# Patient Record
Sex: Female | Born: 1980 | Race: Black or African American | Hispanic: No | Marital: Single | Smoking: Never smoker
Health system: Southern US, Community
[De-identification: ages and names within clinical notes are randomized; demographics above are authoritative.]

## PROBLEM LIST (undated history)

## (undated) DIAGNOSIS — F410 Panic disorder [episodic paroxysmal anxiety] without agoraphobia: Secondary | ICD-10-CM

## (undated) HISTORY — DX: Panic disorder (episodic paroxysmal anxiety): F41.0

---

## 2001-07-12 ENCOUNTER — Emergency Department (HOSPITAL_COMMUNITY): Admission: EM | Admit: 2001-07-12 | Discharge: 2001-07-12 | Payer: Self-pay | Admitting: Emergency Medicine

## 2002-04-03 ENCOUNTER — Emergency Department (HOSPITAL_COMMUNITY): Admission: EM | Admit: 2002-04-03 | Discharge: 2002-04-03 | Payer: Self-pay

## 2002-12-20 ENCOUNTER — Inpatient Hospital Stay (HOSPITAL_COMMUNITY): Admission: AD | Admit: 2002-12-20 | Discharge: 2002-12-22 | Payer: Self-pay | Admitting: *Deleted

## 2010-04-06 ENCOUNTER — Encounter: Admission: RE | Admit: 2010-04-06 | Discharge: 2010-04-06 | Payer: Self-pay | Admitting: Family Medicine

## 2012-10-12 ENCOUNTER — Other Ambulatory Visit: Payer: Self-pay | Admitting: Gastroenterology

## 2012-10-12 DIAGNOSIS — K5900 Constipation, unspecified: Secondary | ICD-10-CM

## 2012-10-23 ENCOUNTER — Other Ambulatory Visit: Payer: Self-pay

## 2012-11-02 ENCOUNTER — Ambulatory Visit
Admission: RE | Admit: 2012-11-02 | Discharge: 2012-11-02 | Disposition: A | Discharge status: Home / Self Care | Payer: BC Managed Care – PPO | Source: Ambulatory Visit | Attending: Gastroenterology | Admitting: Gastroenterology

## 2012-11-02 DIAGNOSIS — K5900 Constipation, unspecified: Secondary | ICD-10-CM

## 2013-12-01 DIAGNOSIS — F419 Anxiety disorder, unspecified: Secondary | ICD-10-CM | POA: Insufficient documentation

## 2013-12-01 DIAGNOSIS — F410 Panic disorder [episodic paroxysmal anxiety] without agoraphobia: Secondary | ICD-10-CM | POA: Insufficient documentation

## 2014-03-15 IMAGING — RF DG BE W/ AIR HIGH DENSITY
20 of 24 series · 20 of 24 positions shown · non-contrast
Comparison: None.

CLINICAL DATA: Constipation.

AIR-CONTRAST BARIUM ENEMA
TECHNIQUE: After obtaining a scout radiograph air-contrast barium
enema was performed under fluoroscopy using high-density barium.
Fluoroscopy Time: 4 minutes 30 seconds.

[Series 1: run · 1 of 1 slices shown (1 of 20)]
[im 1/1]
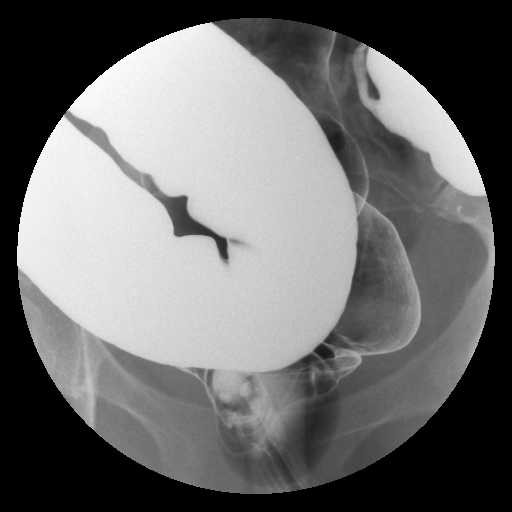

[Series 2: run · 1 of 1 slices shown (2 of 20)]
[im 1/1]
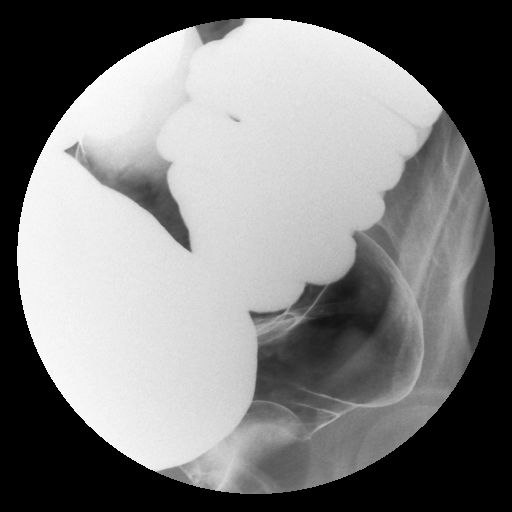

[Series 4: run · 1 of 1 slices shown (3 of 20)]
[im 1/1]
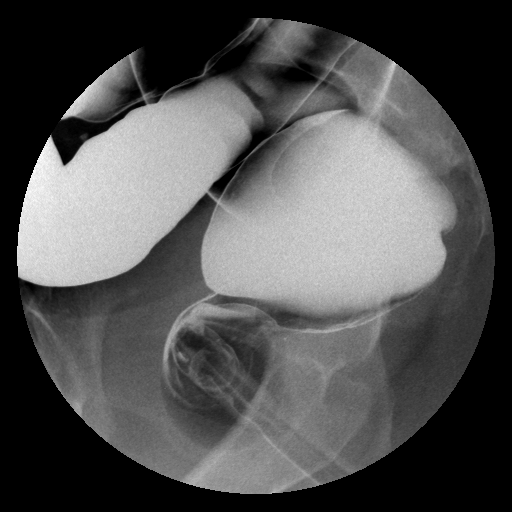

[Series 5: run · 1 of 1 slices shown (4 of 20)]
[im 1/1]
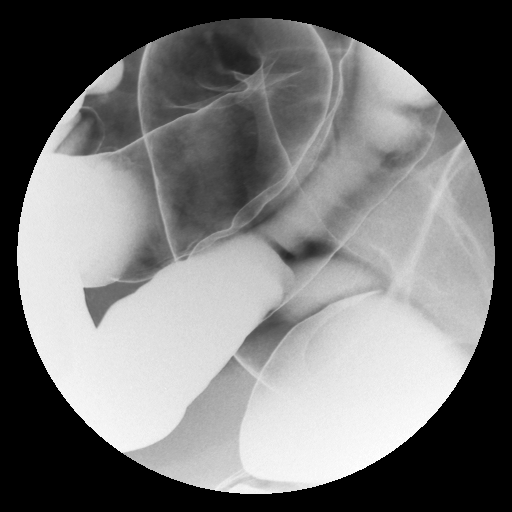

[Series 6: run · 1 of 1 slices shown (5 of 20)]
[im 1/1]
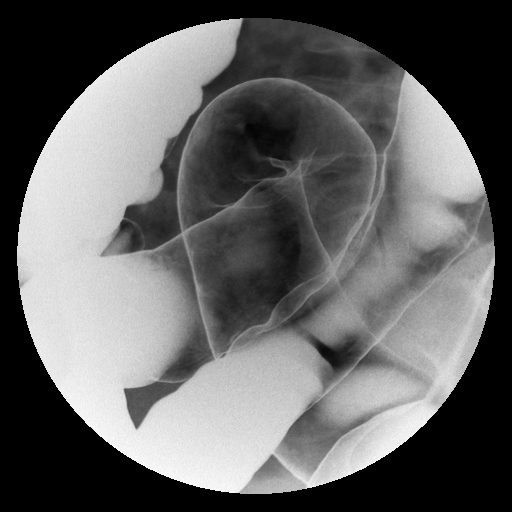

[Series 7: run · 1 of 1 slices shown (6 of 20)]
[im 1/1]
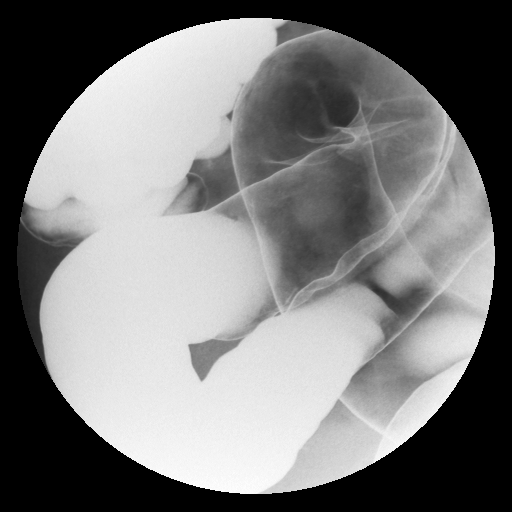

[Series 8: run · 1 of 1 slices shown (7 of 20)]
[im 1/1]
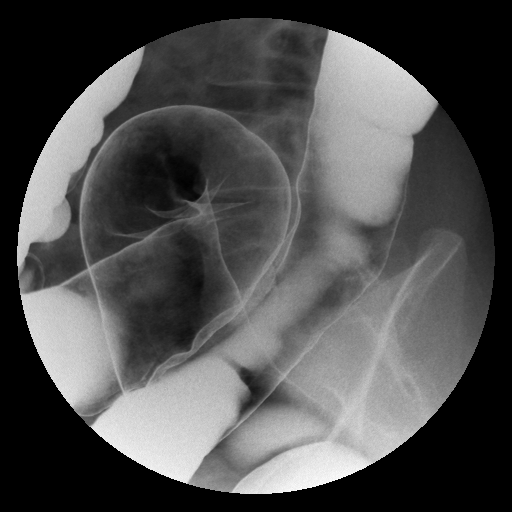

[Series 10: run · 1 of 1 slices shown (8 of 20)]
[im 1/1]
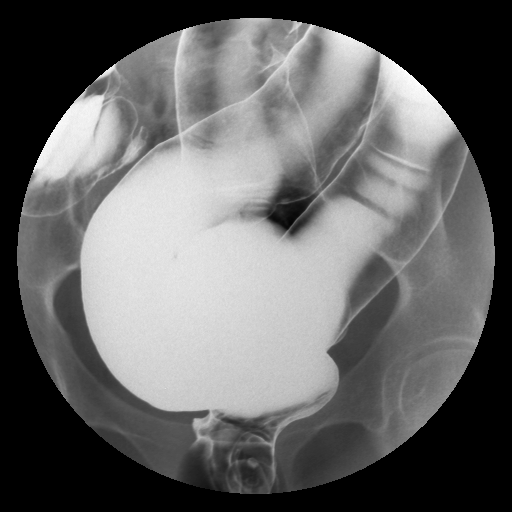

[Series 11: run · 1 of 1 slices shown (9 of 20)]
[im 1/1]
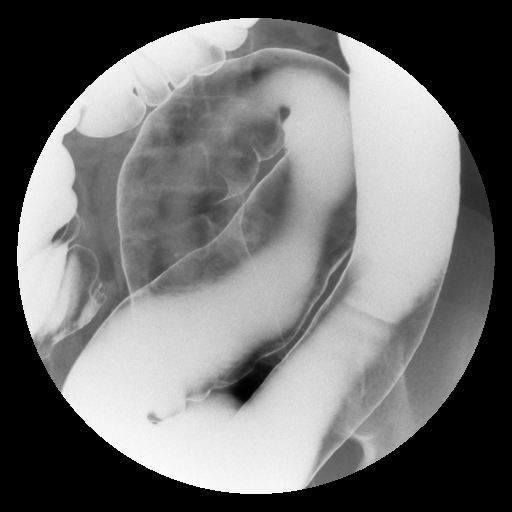

[Series 12: run · 1 of 1 slices shown (10 of 20)]
[im 1/1]
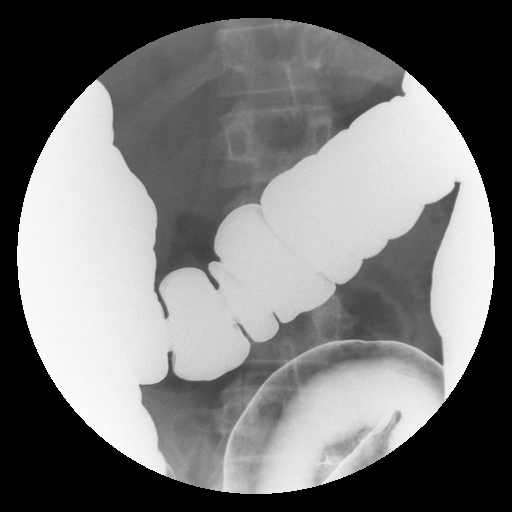

[Series 13: run · 1 of 1 slices shown (11 of 20)]
[im 1/1]
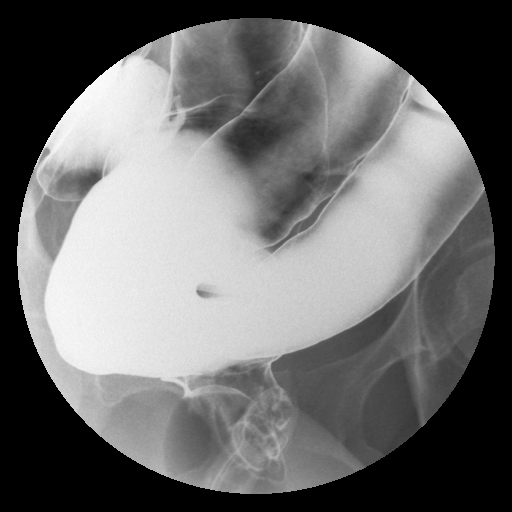

[Series 14: run · 1 of 1 slices shown (12 of 20)]
[im 1/1]
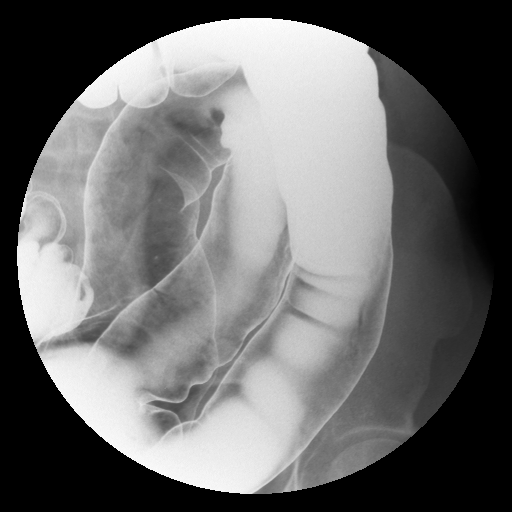

[Series 16: run · 1 of 1 slices shown (13 of 20)]
[im 1/1]
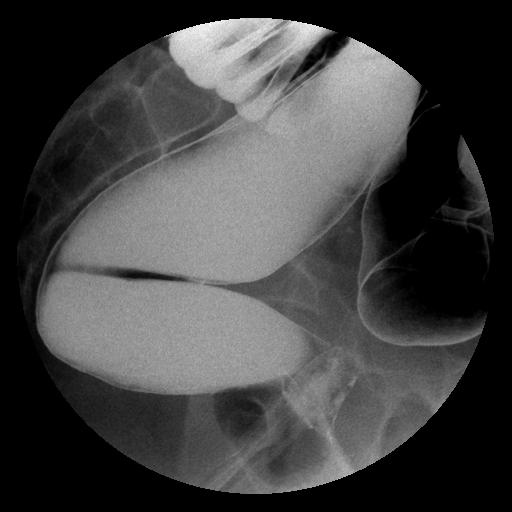

[Series 17: run · 1 of 1 slices shown (14 of 20)]
[im 1/1]
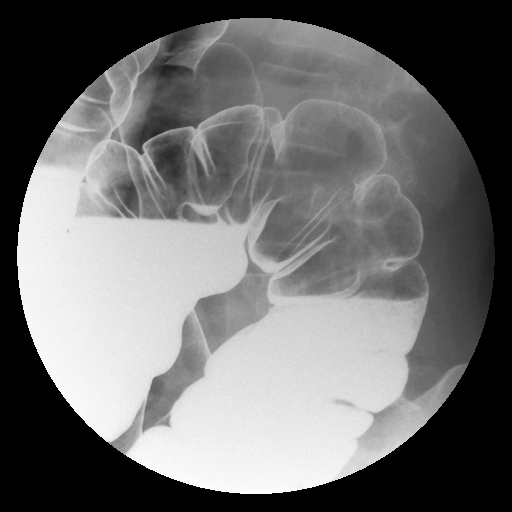

[Series 18: run · 1 of 1 slices shown (15 of 20)]
[im 1/1]
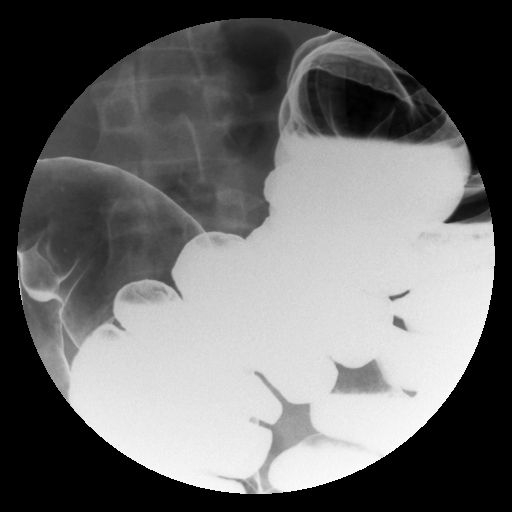

[Series 19: run · 1 of 1 slices shown (16 of 20)]
[im 1/1]
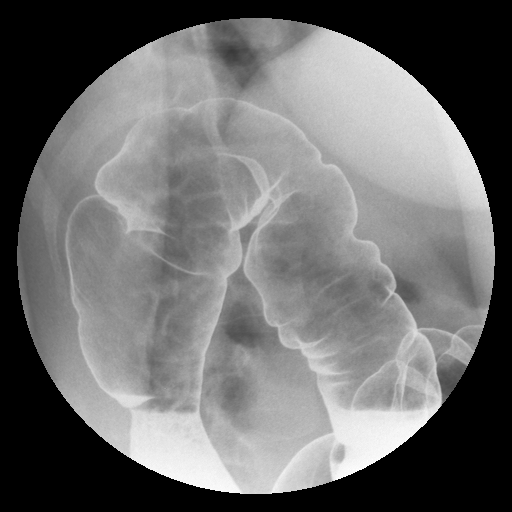

[Series 20: run · 1 of 1 slices shown (17 of 20)]
[im 1/1]
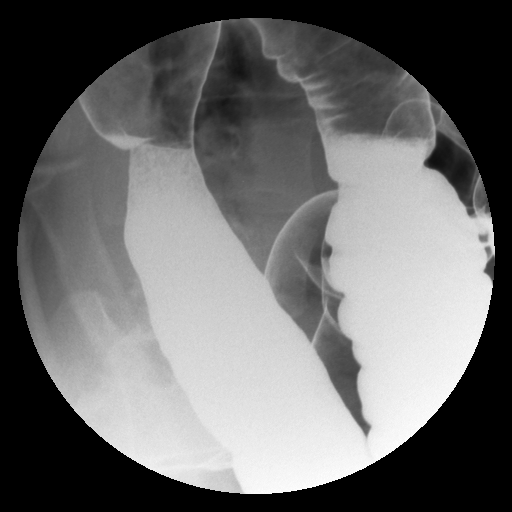

[Series 22: run · 1 of 1 slices shown (18 of 20)]
[im 1/1]
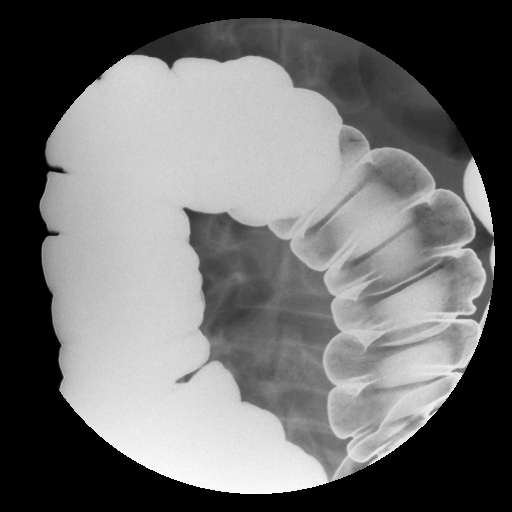

[Series 23: run · 1 of 1 slices shown (19 of 20)]
[im 1/1]
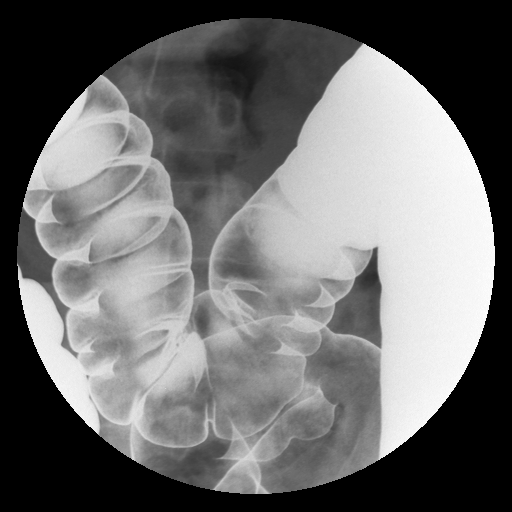

[Series 24: run · 1 of 1 slices shown (20 of 20)]
[im 1/1]
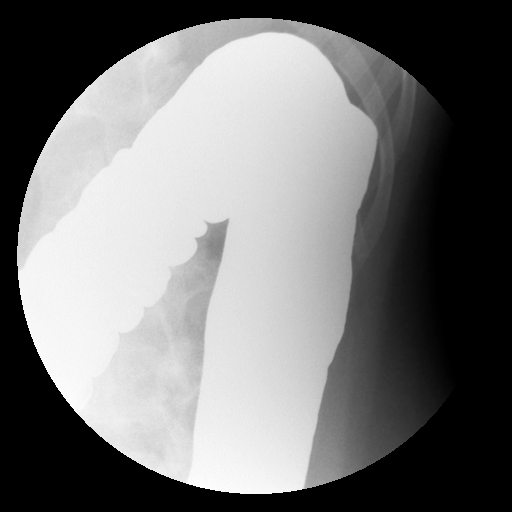

[20 of 24 positions shown; findings below may reference images not displayed]

FINDINGS: Scout view of the abdomen shows a normal bowel gas
pattern.  No unexpected radiopaque calcifications.  Intrauterine
contraceptive device is seen in the anatomic pelvis.

Double contrast examination of the colon shows a tortuous sigmoid.
No polyp, stricture or mass.
IMPRESSION: Normal exam.

## 2019-11-15 ENCOUNTER — Encounter: Payer: Self-pay | Admitting: Emergency Medicine

## 2019-11-15 ENCOUNTER — Other Ambulatory Visit: Payer: Self-pay

## 2019-11-15 ENCOUNTER — Emergency Department
Admission: EM | Admit: 2019-11-15 | Discharge: 2019-11-15 | Disposition: A | Discharge status: Home / Self Care | Payer: BC Managed Care – PPO | Source: Home / Self Care | Attending: Family Medicine | Admitting: Family Medicine

## 2019-11-15 DIAGNOSIS — B029 Zoster without complications: Secondary | ICD-10-CM

## 2019-11-15 MED ORDER — PREDNISONE 20 MG PO TABS: ORAL_TABLET | ORAL | 0 refills | Status: AC

## 2019-11-15 MED ORDER — VALACYCLOVIR HCL 1 G PO TABS: ORAL_TABLET | ORAL | 0 refills | Status: AC

## 2019-11-15 NOTE — ED Triage Notes (Signed)
Rash (small bumps) in front of R ear on Friday night Woke up this am with bumps around mouth & one on the inside Pt c/o of sharp tingling sensation  Took Benadryl last night - min relief Covid Vaccine [employer] April ) Denies any new detergents or hair products

## 2019-11-15 NOTE — ED Provider Notes (Signed)
[facility]    CSN: [id number] Arrival date & time: 11/15/19  1315      History   Chief Complaint Chief Complaint  Patient presents with  . Rash    HPI Meghan Rodriguez is a 39 y.o. female.   Patient reports that she developed a tingling sensation in her right upper lateral lip three days ago followed by some small bumps on her right cheek and in front of her right ear.  She also has some small lesions on her right anterior buccal mucosae.  She has developed vague pain in her right ear canal.  She feels well otherwise.  The history is provided by the patient.  Rash Location:  Face Facial rash location:  R cheek and upper lip Quality: dryness, itchiness, painful and redness   Quality: not blistering, not bruising, not burning, not draining, not peeling, not scaling, not swelling and not weeping   Pain details:    Quality:  Itching and tingling   Severity:  Mild   Onset quality:  Gradual   Duration:  3 days   Timing:  Constant   Progression:  Worsening Severity:  Mild Onset quality:  Gradual Duration:  3 days Timing:  Constant Progression:  Worsening Chronicity:  New Context: not animal contact, not chemical exposure, not exposure to similar rash, not food, not hot tub use, not insect bite/sting, not medications, not new detergent/soap, not nuts and not plant contact   Relieved by:  Nothing Ineffective treatments:  Antihistamines Associated symptoms: no abdominal pain, no diarrhea, no fatigue, no fever, no headaches, no induration, no joint pain, no myalgias, no nausea, no periorbital edema, no shortness of breath, no sore throat, no throat swelling, no tongue swelling and no URI     Past Medical History:  Diagnosis Date  . Panic attacks     Patient Active Problem List   Diagnosis Date Noted  . Panic attacks 12/01/2013  . Anxiety 12/01/2013    History reviewed. No pertinent surgical history.  OB History   No obstetric history on file.      Home  Medications    Prior to Admission medications   Medication Sig Start Date End Date Taking? Authorizing Provider  Biotin 5000 MCG CAPS Take by mouth.   Yes [provider]  cholecalciferol (VITAMIN D3) 25 MCG (1000 UNIT) tablet Take 5,000 Units by mouth daily.   Yes [provider]  vitamin B-12 (CYANOCOBALAMIN) 1000 MCG tablet Take 1,000 mcg by mouth daily.   Yes [provider]  ALPRAZolam (XANAX) 0.25 MG tablet Take 0.25 mg by mouth at bedtime as needed for anxiety.    [provider]  levonorgestrel (MIRENA) 20 MCG/24HR IUD 1 each by Intrauterine route once.    [provider]  LINZESS 145 MCG CAPS capsule Take 145 mcg by mouth daily. 11/14/19   [provider]  predniSONE (DELTASONE) 20 MG tablet Take one tab by mouth twice daily for 4 days, then one daily. Take with food. 11/15/19   Lattie Haw, MD  valACYclovir (VALTREX) 1000 MG tablet Take one tab PO Q8hr 11/15/19   Lattie Haw, MD    Family History Family History  Problem Relation Age of Onset  . Healthy Mother   . Hypertension Father   . Healthy Sister   . Healthy Brother   . Healthy Sister   . Healthy Brother     Social History Social History   Tobacco Use  . Smoking status: Never  Smoker  . Smokeless tobacco: Never Used  Substance Use Topics  . Alcohol use: Never  . Drug use: Never     Allergies   Patient has no known allergies.   Review of Systems Review of Systems  Constitutional: Negative for activity change, chills, diaphoresis, fatigue and fever.  HENT: Positive for ear pain and mouth sores. Negative for ear discharge, sinus pressure, sinus pain and sore throat.   Eyes: Negative.   Respiratory: Negative for cough and shortness of breath.   Cardiovascular: Negative.   Gastrointestinal: Negative for abdominal pain, diarrhea and nausea.  Genitourinary: Negative.   Musculoskeletal: Negative for arthralgias and myalgias.  Skin: Positive for rash.   Neurological: Negative for headaches.     Physical Exam Triage Vital Signs ED Triage Vitals  Enc Vitals Group     BP 11/15/19 1337 101/68     Pulse Rate 11/15/19 1337 63     Resp 11/15/19 1337 16     Temp 11/15/19 1337 99.3 F (37.4 C)     Temp Source 11/15/19 1337 Oral     SpO2 11/15/19 1337 99 %     Weight 11/15/19 1338 198 lb (89.8 kg)     Height 11/15/19 1338 5' 5.5" (1.664 m)     Head Circumference --      Peak Flow --      Pain Score --      Pain Loc --      Pain Edu? --      Excl. in GC? --    No data found.  Updated Vital Signs BP 101/68 (BP Location: Left Arm)   Pulse 63   Temp 99.3 F (37.4 C) (Oral)   Resp 16   Ht 5' 5.5" (1.664 m)   Wt 89.8 kg   SpO2 99%   BMI 32.45 kg/m   Visual Acuity Right Eye Distance:   Left Eye Distance:   Bilateral Distance:    Right Eye Near:   Left Eye Near:    Bilateral Near:     Physical Exam Vitals and nursing note reviewed.  Constitutional:      General: She is not in acute distress. HENT:     Head: Atraumatic.      Comments: Right face has scattered herpetiform lesions as noted on diagram.     Right Ear: Tympanic membrane and external ear normal. There is no impacted cerumen.     Left Ear: Tympanic membrane and external ear normal. There is no impacted cerumen.     Ears:     Comments: Right distal canal has a 76mm vesicle on erythematous base.    Nose: Nose normal.     Mouth/Throat:     Mouth: Mucous membranes are moist.  Eyes:     Conjunctiva/sclera: Conjunctivae normal.     Pupils: Pupils are equal, round, and reactive to light.  Cardiovascular:     Rate and Rhythm: Normal rate.  Pulmonary:     Effort: Pulmonary effort is normal.  Lymphadenopathy:     Cervical: No cervical adenopathy.  Skin:    General: Skin is warm and dry.  Neurological:     Mental Status: She is alert and oriented to person, place, and time.      UC Treatments / Results  Labs (all labs ordered are listed, but only  abnormal results are displayed) Labs Reviewed - No data to display  EKG   Radiology No results found.  Procedures Procedures (including critical care time)  Medications Ordered  in UC Medications - No data to display  Initial Impression / Assessment and Plan / UC Course  I have reviewed the triage vital signs and the nursing notes.  Pertinent labs & imaging results that were available during my care of the patient were reviewed by me and considered in my medical decision making (see chart for details).    Begin Valtrex and prednisone burst/taper. Followup with ENT if not resolved one week.   Final Clinical Impressions(s) / UC Diagnoses   Final diagnoses:  Herpes zoster without complication     Discharge Instructions     May take Tylenol if needed for pain    ED Prescriptions    Medication Sig Dispense Auth. Provider   valACYclovir (VALTREX) 1000 MG tablet Take one tab PO Q8hr 21 tablet Kandra Nicolas, MD   predniSONE (DELTASONE) 20 MG tablet Take one tab by mouth twice daily for 4 days, then one daily. Take with food. 12 tablet Kandra Nicolas, MD        Kandra Nicolas, MD 11/16/19 [phone]

## 2019-11-15 NOTE — Discharge Instructions (Addendum)
May take Tylenol if needed for pain °
